# Patient Record
Sex: Female | Born: 1990 | Race: White | Hispanic: No | Marital: Married | Smoking: Never smoker
Health system: Southern US, Community
[De-identification: ages and names within clinical notes are randomized; demographics above are authoritative.]

## PROBLEM LIST (undated history)

## (undated) DIAGNOSIS — H9190 Unspecified hearing loss, unspecified ear: Secondary | ICD-10-CM

## (undated) DIAGNOSIS — F419 Anxiety disorder, unspecified: Secondary | ICD-10-CM

## (undated) HISTORY — PX: WISDOM TOOTH EXTRACTION: SHX21

## (undated) HISTORY — PX: TONSILLECTOMY: SUR1361

---

## 2018-04-05 LAB — OB RESULTS CONSOLE ABO/RH: RH Type: POSITIVE

## 2018-04-05 LAB — OB RESULTS CONSOLE HEPATITIS B SURFACE ANTIGEN: Hepatitis B Surface Ag: NEGATIVE

## 2018-04-05 LAB — OB RESULTS CONSOLE HIV ANTIBODY (ROUTINE TESTING): HIV: NONREACTIVE

## 2018-04-05 LAB — OB RESULTS CONSOLE RUBELLA ANTIBODY, IGM: Rubella: IMMUNE

## 2018-04-05 LAB — OB RESULTS CONSOLE RPR: RPR: NONREACTIVE

## 2018-04-05 LAB — OB RESULTS CONSOLE ANTIBODY SCREEN: Antibody Screen: NEGATIVE

## 2018-04-12 LAB — OB RESULTS CONSOLE GC/CHLAMYDIA
Chlamydia: NEGATIVE
Gonorrhea: NEGATIVE

## 2018-06-11 ENCOUNTER — Other Ambulatory Visit (HOSPITAL_COMMUNITY): Payer: Self-pay | Admitting: Certified Nurse Midwife

## 2018-06-11 DIAGNOSIS — Z3689 Encounter for other specified antenatal screening: Secondary | ICD-10-CM

## 2018-06-30 ENCOUNTER — Encounter (HOSPITAL_COMMUNITY): Payer: Self-pay

## 2018-07-02 ENCOUNTER — Encounter (HOSPITAL_COMMUNITY): Payer: Self-pay

## 2018-07-02 ENCOUNTER — Ambulatory Visit (HOSPITAL_COMMUNITY): Payer: Self-pay

## 2018-07-07 ENCOUNTER — Encounter (HOSPITAL_COMMUNITY): Payer: Self-pay

## 2018-07-07 ENCOUNTER — Ambulatory Visit (HOSPITAL_COMMUNITY)
Admission: RE | Admit: 2018-07-07 | Discharge: 2018-07-07 | Disposition: A | Discharge status: Home / Self Care | Payer: BLUE CROSS/BLUE SHIELD | Source: Ambulatory Visit | Attending: Certified Nurse Midwife | Admitting: Certified Nurse Midwife

## 2018-07-07 DIAGNOSIS — Z3689 Encounter for other specified antenatal screening: Secondary | ICD-10-CM | POA: Insufficient documentation

## 2018-07-07 DIAGNOSIS — Z3A21 21 weeks gestation of pregnancy: Secondary | ICD-10-CM | POA: Insufficient documentation

## 2018-07-07 DIAGNOSIS — Z87798 Personal history of other (corrected) congenital malformations: Secondary | ICD-10-CM | POA: Diagnosis not present

## 2018-07-07 DIAGNOSIS — Z8279 Family history of other congenital malformations, deformations and chromosomal abnormalities: Secondary | ICD-10-CM | POA: Diagnosis not present

## 2018-07-07 HISTORY — DX: Anxiety disorder, unspecified: F41.9

## 2018-07-07 HISTORY — DX: Unspecified hearing loss, unspecified ear: H91.90

## 2018-07-07 NOTE — ED Notes (Signed)
Patient in session with Genetic Counselor. 

## 2018-07-08 ENCOUNTER — Other Ambulatory Visit (HOSPITAL_COMMUNITY): Payer: Self-pay | Admitting: *Deleted

## 2018-07-08 DIAGNOSIS — Z362 Encounter for other antenatal screening follow-up: Secondary | ICD-10-CM

## 2018-08-04 ENCOUNTER — Encounter (HOSPITAL_COMMUNITY): Payer: Self-pay

## 2018-08-04 ENCOUNTER — Ambulatory Visit (HOSPITAL_COMMUNITY)
Admission: RE | Admit: 2018-08-04 | Discharge: 2018-08-04 | Disposition: A | Discharge status: Home / Self Care | Payer: BLUE CROSS/BLUE SHIELD | Source: Ambulatory Visit | Attending: Certified Nurse Midwife | Admitting: Certified Nurse Midwife

## 2018-08-04 DIAGNOSIS — Z3A25 25 weeks gestation of pregnancy: Secondary | ICD-10-CM | POA: Diagnosis not present

## 2018-08-04 DIAGNOSIS — Z362 Encounter for other antenatal screening follow-up: Secondary | ICD-10-CM | POA: Diagnosis present

## 2018-10-25 LAB — OB RESULTS CONSOLE GBS: GBS: NEGATIVE

## 2018-11-03 NOTE — L&D Delivery Note (Signed)
Delivery Note:  MBC labor intrapartum transfer for augmentation / pain control Fentanyl and phenergan x 1 then epidural Labile BP in labor, MSAF  Onset of labor at 2345 11/15/2018 Transfer to [facility] at 2020 11/16/2018 Complete dilation at 11/17/2018 0223 Onset of pushing at 11/17/2018 0223 FHR second stage 2 - variables with late component  Analgesia /Anesthesia intrapartum:Epidural   Delivery of a Live born female  Birth Weight:  pending APGAR: 9, 9   Newborn Delivery   Birth date/time:  11/17/2018 02:40:00 Delivery type:  Vaginal, Spontaneous    Baby delivered OA to ROT, compound L arm, easy shoulders and body, strong cry at birth, to mother's chest for immediate STS.  Nuchal Cord: No  Cord double clamped after cessation of pulsation, cut by [facility].  Collection of cord blood for typing - yes Arterial cord blood sample-   no  Placenta delivered-Spontaneous  with 3 vessels . Placenta to parents per request. Uterine tone firm, bleeding small  L labial laceration identified.  Anesthesia:Epidural  Est. Blood Loss (mL):200 Complications: ,Other Labor Complications: protracted labor, MSAF Mom to postpartum.  Baby Randell Loop to Couplet care / Skin to Skin.  Delivery Report:  Review the Delivery Report for details.     Signed: Neta Mends, CNM, MSN 11/17/2018, 3:18 AM

## 2018-11-16 ENCOUNTER — Encounter (HOSPITAL_COMMUNITY): Payer: Self-pay

## 2018-11-16 ENCOUNTER — Inpatient Hospital Stay (HOSPITAL_COMMUNITY): Payer: BLUE CROSS/BLUE SHIELD | Admitting: Anesthesiology

## 2018-11-16 ENCOUNTER — Inpatient Hospital Stay (HOSPITAL_COMMUNITY)
Admission: AD | Admit: 2018-11-16 | Discharge: 2018-11-18 | DRG: 807 | Disposition: A | Discharge status: Home / Self Care | Payer: BLUE CROSS/BLUE SHIELD

## 2018-11-16 DIAGNOSIS — O9902 Anemia complicating childbirth: Secondary | ICD-10-CM | POA: Diagnosis present

## 2018-11-16 DIAGNOSIS — D649 Anemia, unspecified: Secondary | ICD-10-CM | POA: Diagnosis present

## 2018-11-16 DIAGNOSIS — R030 Elevated blood-pressure reading, without diagnosis of hypertension: Secondary | ICD-10-CM | POA: Diagnosis present

## 2018-11-16 DIAGNOSIS — Z3A39 39 weeks gestation of pregnancy: Secondary | ICD-10-CM | POA: Diagnosis not present

## 2018-11-16 DIAGNOSIS — O26893 Other specified pregnancy related conditions, third trimester: Secondary | ICD-10-CM | POA: Diagnosis present

## 2018-11-16 DIAGNOSIS — O169 Unspecified maternal hypertension, unspecified trimester: Secondary | ICD-10-CM | POA: Diagnosis present

## 2018-11-16 DIAGNOSIS — Z3483 Encounter for supervision of other normal pregnancy, third trimester: Secondary | ICD-10-CM | POA: Diagnosis present

## 2018-11-16 LAB — CBC
HCT: 34.4 % — ABNORMAL LOW (ref 36.0–46.0)
Hemoglobin: 11.2 g/dL — ABNORMAL LOW (ref 12.0–15.0)
MCH: 29.4 pg (ref 26.0–34.0)
MCHC: 32.6 g/dL (ref 30.0–36.0)
MCV: 90.3 fL (ref 80.0–100.0)
Platelets: 227 10*3/uL (ref 150–400)
RBC: 3.81 MIL/uL — ABNORMAL LOW (ref 3.87–5.11)
RDW: 14.7 % (ref 11.5–15.5)
WBC: 16.8 10*3/uL — AB (ref 4.0–10.5)
nRBC: 0 % (ref 0.0–0.2)

## 2018-11-16 LAB — COMPREHENSIVE METABOLIC PANEL
ALBUMIN: 3 g/dL — AB (ref 3.5–5.0)
ALT: 18 U/L (ref 0–44)
ANION GAP: 11 (ref 5–15)
AST: 22 U/L (ref 15–41)
Alkaline Phosphatase: 129 U/L — ABNORMAL HIGH (ref 38–126)
BUN: 7 mg/dL (ref 6–20)
CO2: 20 mmol/L — ABNORMAL LOW (ref 22–32)
Calcium: 8.8 mg/dL — ABNORMAL LOW (ref 8.9–10.3)
Chloride: 102 mmol/L (ref 98–111)
Creatinine, Ser: 0.62 mg/dL (ref 0.44–1.00)
GFR calc Af Amer: >60 mL/min (ref >60)
GFR calc non Af Amer: >60 mL/min (ref >60)
GLUCOSE: 90 mg/dL (ref 70–99)
POTASSIUM: 4 mmol/L (ref 3.5–5.1)
Sodium: 133 mmol/L — ABNORMAL LOW (ref 135–145)
Total Bilirubin: 0.7 mg/dL (ref 0.3–1.2)
Total Protein: 5.9 g/dL — ABNORMAL LOW (ref 6.5–8.1)

## 2018-11-16 LAB — TYPE AND SCREEN
ABO/RH(D): A POS
Antibody Screen: NEGATIVE

## 2018-11-16 MED ORDER — LIDOCAINE HCL (PF) 1 % IJ SOLN
30.0000 mL | INTRAMUSCULAR | Status: DC | PRN
  Filled 2018-11-16: qty 30

## 2018-11-16 MED ORDER — OXYTOCIN 10 UNIT/ML IJ SOLN: 10.0000 [IU] | Freq: Once | INTRAMUSCULAR | Status: DC

## 2018-11-16 MED ORDER — LIDOCAINE HCL (PF) 1 % IJ SOLN
INTRAMUSCULAR | Status: DC | PRN
  Administered 2018-11-16 (×2): 5 mL via EPIDURAL

## 2018-11-16 MED ORDER — PHENYLEPHRINE 40 MCG/ML (10ML) SYRINGE FOR IV PUSH (FOR BLOOD PRESSURE SUPPORT)
80.0000 ug | PREFILLED_SYRINGE | INTRAVENOUS | Status: DC | PRN
  Filled 2018-11-16 (×2): qty 10

## 2018-11-16 MED ORDER — OXYCODONE-ACETAMINOPHEN 5-325 MG PO TABS: 2.0000 | ORAL_TABLET | ORAL | Status: DC | PRN

## 2018-11-16 MED ORDER — FENTANYL 2.5 MCG/ML BUPIVACAINE 1/10 % EPIDURAL INFUSION (WH - ANES)
14.0000 mL/h | INTRAMUSCULAR | Status: DC | PRN
  Administered 2018-11-16: 14 mL/h via EPIDURAL
  Filled 2018-11-16: qty 100

## 2018-11-16 MED ORDER — PHENYLEPHRINE 40 MCG/ML (10ML) SYRINGE FOR IV PUSH (FOR BLOOD PRESSURE SUPPORT)
80.0000 ug | PREFILLED_SYRINGE | INTRAVENOUS | Status: DC | PRN
  Filled 2018-11-16: qty 10

## 2018-11-16 MED ORDER — LACTATED RINGERS IV SOLN
INTRAVENOUS | Status: DC
  Administered 2018-11-16 (×2): via INTRAVENOUS

## 2018-11-16 MED ORDER — SOD CITRATE-CITRIC ACID 500-334 MG/5ML PO SOLN: 30.0000 mL | ORAL | Status: DC | PRN

## 2018-11-16 MED ORDER — OXYTOCIN 40 UNITS IN NORMAL SALINE INFUSION - SIMPLE MED
2.5000 [IU]/h | INTRAVENOUS | Status: DC
  Administered 2018-11-17: 2.5 [IU]/h via INTRAVENOUS
  Filled 2018-11-16: qty 1000

## 2018-11-16 MED ORDER — EPHEDRINE 5 MG/ML INJ
10.0000 mg | INTRAVENOUS | Status: DC | PRN
  Filled 2018-11-16: qty 2

## 2018-11-16 MED ORDER — ONDANSETRON HCL 4 MG/2ML IJ SOLN: 4.0000 mg | Freq: Four times a day (QID) | INTRAMUSCULAR | Status: DC | PRN

## 2018-11-16 MED ORDER — DIPHENHYDRAMINE HCL 50 MG/ML IJ SOLN: 12.5000 mg | INTRAMUSCULAR | Status: DC | PRN

## 2018-11-16 MED ORDER — OXYCODONE-ACETAMINOPHEN 5-325 MG PO TABS: 1.0000 | ORAL_TABLET | ORAL | Status: DC | PRN

## 2018-11-16 MED ORDER — LACTATED RINGERS IV SOLN
INTRAVENOUS | Status: DC
  Administered 2018-11-16: 21:00:00 via INTRAVENOUS

## 2018-11-16 MED ORDER — LACTATED RINGERS IV SOLN
500.0000 mL | INTRAVENOUS | Status: DC | PRN
  Administered 2018-11-17: 500 mL via INTRAVENOUS

## 2018-11-16 MED ORDER — OXYTOCIN 40 UNITS IN NORMAL SALINE INFUSION - SIMPLE MED
1.0000 m[IU]/min | INTRAVENOUS | Status: DC
  Administered 2018-11-16: 2 m[IU]/min via INTRAVENOUS

## 2018-11-16 MED ORDER — TERBUTALINE SULFATE 1 MG/ML IJ SOLN
0.2500 mg | Freq: Once | INTRAMUSCULAR | Status: DC | PRN
  Filled 2018-11-16: qty 1

## 2018-11-16 MED ORDER — FENTANYL CITRATE (PF) 100 MCG/2ML IJ SOLN
100.0000 ug | INTRAMUSCULAR | Status: AC
  Administered 2018-11-16: 100 ug via INTRAVENOUS

## 2018-11-16 MED ORDER — LACTATED RINGERS IV SOLN
500.0000 mL | Freq: Once | INTRAVENOUS | Status: AC
  Administered 2018-11-16: 500 mL via INTRAVENOUS

## 2018-11-16 MED ORDER — OXYTOCIN BOLUS FROM INFUSION
500.0000 mL | Freq: Once | INTRAVENOUS | Status: AC
  Administered 2018-11-17: 500 mL via INTRAVENOUS

## 2018-11-16 MED ORDER — ACETAMINOPHEN 325 MG PO TABS
650.0000 mg | ORAL_TABLET | ORAL | Status: DC | PRN
  Administered 2018-11-16: 650 mg via ORAL
  Filled 2018-11-16: qty 2

## 2018-11-16 MED ORDER — PROMETHAZINE HCL 25 MG/ML IJ SOLN
25.0000 mg | INTRAMUSCULAR | Status: AC
  Administered 2018-11-16: 25 mg via INTRAVENOUS
  Filled 2018-11-16: qty 1

## 2018-11-16 MED ORDER — FENTANYL CITRATE (PF) 100 MCG/2ML IJ SOLN
INTRAMUSCULAR | Status: AC
  Administered 2018-11-16: 100 ug via INTRAVENOUS
  Filled 2018-11-16: qty 2

## 2018-11-16 NOTE — Anesthesia Preprocedure Evaluation (Signed)
Anesthesia Evaluation  Patient identified by MRN, date of birth, ID band Patient awake    Reviewed: Allergy & Precautions, H&P , NPO status , Patient's Chart, lab work & pertinent test results  History of Anesthesia Complications Negative for: history of anesthetic complications  Airway Mallampati: II  TM Distance: >3 FB Neck ROM: full    Dental no notable dental hx. (+) Teeth Intact   Pulmonary neg pulmonary ROS,    Pulmonary exam normal breath sounds clear to auscultation       Cardiovascular negative cardio ROS Normal cardiovascular exam Rhythm:regular Rate:Normal     Neuro/Psych Anxiety negative neurological ROS     GI/Hepatic negative GI ROS, Neg liver ROS,   Endo/Other  negative endocrine ROS  Renal/GU negative Renal ROS  negative genitourinary   Musculoskeletal   Abdominal   Peds  Hematology  (+) Blood dyscrasia, anemia ,   Anesthesia Other Findings   Reproductive/Obstetrics (+) Pregnancy                             Anesthesia Physical Anesthesia Plan  ASA: II  Anesthesia Plan: Epidural   Post-op Pain Management:    Induction:   PONV Risk Score and Plan:   Airway Management Planned:   Additional Equipment:   Intra-op Plan:   Post-operative Plan:   Informed Consent: I have reviewed the patients History and Physical, chart, labs and discussed the procedure including the risks, benefits and alternatives for the proposed anesthesia with the patient or authorized representative who has indicated his/her understanding and acceptance.       Plan Discussed with:   Anesthesia Plan Comments:         Anesthesia Quick Evaluation  

## 2018-11-16 NOTE — Anesthesia Procedure Notes (Signed)
Epidural Patient location during procedure: OB Start time: 11/16/2018 10:09 PM End time: 11/16/2018 10:19 PM  Staffing Anesthesiologist: Leonides Grills, MD Performed: anesthesiologist   Preanesthetic Checklist Completed: patient identified, site marked, pre-op evaluation, timeout performed, IV checked, risks and benefits discussed and monitors and equipment checked  Epidural Patient position: sitting Prep: DuraPrep Patient monitoring: heart rate, cardiac monitor, continuous pulse ox and blood pressure Approach: midline Location: L4-L5 Injection technique: LOR air  Needle:  Needle type: Tuohy  Needle gauge: 17 G Needle length: 9 cm Needle insertion depth: 6 cm Catheter type: closed end flexible Catheter size: 19 Gauge Catheter at skin depth: 11 cm Test dose: negative and Other  Assessment Events: blood not aspirated, injection not painful, no injection resistance and negative IV test  Additional Notes Informed consent obtained prior to proceeding including risk of failure, 1% risk of PDPH, risk of minor discomfort and bruising. Discussed alternatives to epidural analgesia and patient desires to proceed.  Timeout performed pre-procedure verifying patient name, procedure, and platelet count.  Patient tolerated procedure well. Reason for block:procedure for pain

## 2018-11-16 NOTE — MAU Note (Signed)
Pt arrived from [facility] w/CNM [place].  Pt was SROM'd at about 1930, light green mec. Pt has also had labile Bps per CNM. PIH labs per CNM wln. 5-6 cm per CNM.

## 2018-11-16 NOTE — H&P (Addendum)
  OB ADMISSION/ HISTORY & PHYSICAL:  Admission Date: 11/16/2018  8:38 PM  Admit Diagnosis: 39wks CTX LABOR    Cathy Reeves is a 28 y.o. female presenting for augmentation of latent labor / pain control. Transfer [facility] from [facility]. Protracted latent labor since last night, progressed to 5-6 cm / 100 / -1, AROM at 1940, moderate meconium stained fluid noted.  Labile BP with pain, no PEC neural sx, [facility] labs [facility]   Prenatal History: G1P0   EDC : 11/19/2018, by Other Basis  [facility] at [facility] since 1 st trim.   Prenatal course complicated by: Excessive weight gain 42 lbs  Prenatal Labs: ABO, Rh:   A pos Antibody:  neg Rubella:  immune  RPR:   NR HBsAg:   Neg HIV:   Neg GBS:   Neg GDM self screen normal Genetic Screening: Quad screen neg Ultrasound: normal anatomy  Medical / Surgical History :  Past medical history:  Past Medical History:  Diagnosis Date  . Anxiety   . Hearing loss      Past surgical history:  Past Surgical History:  Procedure Laterality Date  . TONSILLECTOMY    . WISDOM TOOTH EXTRACTION       Family History:  Family History  Problem Relation Age of Onset  . Cancer Maternal Aunt   . Cancer Maternal Grandmother   . Cancer Maternal Grandfather   . Diabetes Paternal Grandmother      Social History:  reports that she has never smoked. She has never used smokeless tobacco. She reports previous alcohol use. She reports previous drug use. Drug: Marijuana.   Allergies: Patient has no known allergies.   Current Medications at time of admission:  Medications Prior to Admission  Medication Sig Dispense Refill Last Dose  . Prenatal Vit-Fe Fumarate-FA (PRENATAL VITAMIN PO) Take by mouth.   Taking     Review of Systems: ROS  Physical Exam: Vital signs and nursing notes reviewed.  Vitals:   11/16/18 2042 11/16/18 2104  BP: 136/78 132/82  Pulse: (!) 104 91  Temp:  98.6 F (37 C)  TempSrc:  Oral   SpO2:  100%    General: AAO x 3, NAD, coping poorly w/ ctx Heart: RRR Lungs:CTAB Abdomen: Gravid, NT, Leopold's EFW 8 lbs Extremities: trace pedal edema Genitalia / VE:   5-6/100/-1, acynclitic  FHR: 160 BPM, mod variability, + small accels, - decels TOCO: Ctx q 2 min  Labs:   Pending T&S, CBC, CMP, RPR      Assessment:  28 y.o. G1P0 at 6360w4d Protracted active stage of labor Meconium stained fluid GBS neg Labile BP with normal PEC labs FHT category 1-2  Plan:  1. Admit to BS 2. Routine L&D orders, rpt PEC labs 3. Analgesia/anesthesia PRN  - Fentanyl and Phenergan ordered while in [facility] no available staff or room at this time, high unit acuity 4. Plan Epidural and augmentation / IUPC when unit acuity permits 5. Working towards vaginal delivery   Dr Billy Coastaavon notified of admission / plan of care  Neta MendsDaniela C Paul CNM, MSN 11/16/2018, 8:48 PM

## 2018-11-17 ENCOUNTER — Encounter (HOSPITAL_COMMUNITY): Payer: Self-pay

## 2018-11-17 DIAGNOSIS — O169 Unspecified maternal hypertension, unspecified trimester: Secondary | ICD-10-CM | POA: Diagnosis present

## 2018-11-17 LAB — COMPREHENSIVE METABOLIC PANEL
ALBUMIN: 2.3 g/dL — AB (ref 3.5–5.0)
ALK PHOS: 104 U/L (ref 38–126)
ALT: 16 U/L (ref 0–44)
AST: 25 U/L (ref 15–41)
Anion gap: 8 (ref 5–15)
BUN: 9 mg/dL (ref 6–20)
CO2: 21 mmol/L — ABNORMAL LOW (ref 22–32)
Calcium: 8.2 mg/dL — ABNORMAL LOW (ref 8.9–10.3)
Chloride: 104 mmol/L (ref 98–111)
Creatinine, Ser: 0.73 mg/dL (ref 0.44–1.00)
GFR calc Af Amer: >60 mL/min (ref >60)
GFR calc non Af Amer: >60 mL/min (ref >60)
GLUCOSE: 133 mg/dL — AB (ref 70–99)
Potassium: 4.1 mmol/L (ref 3.5–5.1)
Sodium: 133 mmol/L — ABNORMAL LOW (ref 135–145)
Total Bilirubin: 0.6 mg/dL (ref 0.3–1.2)
Total Protein: 5.2 g/dL — ABNORMAL LOW (ref 6.5–8.1)

## 2018-11-17 LAB — CBC
HCT: 30.5 % — ABNORMAL LOW (ref 36.0–46.0)
HEMOGLOBIN: 9.8 g/dL — AB (ref 12.0–15.0)
MCH: 29.1 pg (ref 26.0–34.0)
MCHC: 32.1 g/dL (ref 30.0–36.0)
MCV: 90.5 fL (ref 80.0–100.0)
Platelets: 197 10*3/uL (ref 150–400)
RBC: 3.37 MIL/uL — ABNORMAL LOW (ref 3.87–5.11)
RDW: 14.8 % (ref 11.5–15.5)
WBC: 19.1 10*3/uL — ABNORMAL HIGH (ref 4.0–10.5)
nRBC: 0.2 % (ref 0.0–0.2)

## 2018-11-17 LAB — RPR: RPR Ser Ql: NONREACTIVE

## 2018-11-17 LAB — ABO/RH: ABO/RH(D): A POS

## 2018-11-17 MED ORDER — ONDANSETRON HCL 4 MG PO TABS: 4.0000 mg | ORAL_TABLET | ORAL | Status: DC | PRN

## 2018-11-17 MED ORDER — DIBUCAINE 1 % RE OINT: 1.0000 "application " | TOPICAL_OINTMENT | RECTAL | Status: DC | PRN

## 2018-11-17 MED ORDER — DIPHENHYDRAMINE HCL 25 MG PO CAPS: 25.0000 mg | ORAL_CAPSULE | Freq: Four times a day (QID) | ORAL | Status: DC | PRN

## 2018-11-17 MED ORDER — WITCH HAZEL-GLYCERIN EX PADS: 1.0000 "application " | MEDICATED_PAD | CUTANEOUS | Status: DC | PRN

## 2018-11-17 MED ORDER — PRENATAL MULTIVITAMIN CH
1.0000 | ORAL_TABLET | Freq: Every day | ORAL | Status: DC
  Filled 2018-11-17 (×2): qty 1

## 2018-11-17 MED ORDER — COCONUT OIL OIL
1.0000 "application " | TOPICAL_OIL | Status: DC | PRN
  Filled 2018-11-17: qty 120

## 2018-11-17 MED ORDER — TETANUS-DIPHTH-ACELL PERTUSSIS 5-2.5-18.5 LF-MCG/0.5 IM SUSP: 0.5000 mL | Freq: Once | INTRAMUSCULAR | Status: DC

## 2018-11-17 MED ORDER — SIMETHICONE 80 MG PO CHEW: 80.0000 mg | CHEWABLE_TABLET | ORAL | Status: DC | PRN

## 2018-11-17 MED ORDER — BISACODYL 10 MG RE SUPP: 10.0000 mg | Freq: Every day | RECTAL | Status: DC | PRN

## 2018-11-17 MED ORDER — ACETAMINOPHEN 325 MG PO TABS
650.0000 mg | ORAL_TABLET | ORAL | Status: DC | PRN
  Administered 2018-11-17 – 2018-11-18 (×3): 650 mg via ORAL
  Filled 2018-11-17 (×3): qty 2

## 2018-11-17 MED ORDER — BENZOCAINE-MENTHOL 20-0.5 % EX AERO: 1.0000 "application " | INHALATION_SPRAY | CUTANEOUS | Status: DC | PRN

## 2018-11-17 MED ORDER — OXYCODONE HCL 5 MG PO TABS
5.0000 mg | ORAL_TABLET | ORAL | Status: DC | PRN
  Administered 2018-11-17: 5 mg via ORAL
  Filled 2018-11-17: qty 1

## 2018-11-17 MED ORDER — DOCUSATE SODIUM 100 MG PO CAPS
100.0000 mg | ORAL_CAPSULE | Freq: Two times a day (BID) | ORAL | Status: DC
  Administered 2018-11-17 – 2018-11-18 (×2): 100 mg via ORAL
  Filled 2018-11-17 (×2): qty 1

## 2018-11-17 MED ORDER — ONDANSETRON HCL 4 MG/2ML IJ SOLN: 4.0000 mg | INTRAMUSCULAR | Status: DC | PRN

## 2018-11-17 MED ORDER — IBUPROFEN 600 MG PO TABS
600.0000 mg | ORAL_TABLET | Freq: Four times a day (QID) | ORAL | Status: DC
  Administered 2018-11-17 – 2018-11-18 (×6): 600 mg via ORAL
  Filled 2018-11-17 (×5): qty 1

## 2018-11-17 MED ORDER — FLEET ENEMA 7-19 GM/118ML RE ENEM: 1.0000 | ENEMA | Freq: Every day | RECTAL | Status: DC | PRN

## 2018-11-17 NOTE — Anesthesia Postprocedure Evaluation (Signed)
Anesthesia Post Note  Patient: Cathy Reeves  Procedure(s) Performed: AN AD HOC LABOR EPIDURAL     Patient location during evaluation: Mother Baby Anesthesia Type: Epidural Level of consciousness: awake, awake and alert and oriented Pain management: pain level controlled Vital Signs Assessment: post-procedure vital signs reviewed and stable Respiratory status: spontaneous breathing and respiratory function stable Cardiovascular status: blood pressure returned to baseline Postop Assessment: no headache, no backache, epidural receding, patient able to bend at knees, no apparent nausea or vomiting, adequate PO intake and able to ambulate Anesthetic complications: no    Last Vitals:  Vitals:   11/17/18 0446 11/17/18 0649  BP: 134/80 130/81  Pulse: 89 (!) 104  Resp:    Temp: 37.3 C 37.1 C  SpO2:      Last Pain:  Vitals:   11/17/18 0649  TempSrc: Oral   Pain Goal:                @ANFLOW60MIN (12500)  )Cleda Clarks

## 2018-11-17 NOTE — Lactation Note (Signed)
This note was copied from a baby's chart. Lactation Consultation Note  Patient Name: Cathy Reeves Date: 11/17/2018 Reason for consult: Initial assessment;Term;Primapara;1st time breastfeeding  P1 mother whose infant is now 3 hours old.  Baby was sleeping in mother's arms when I arrived.  Mother stated she breast fed about 30 minutes ago.  Encouraged to feed 8-12 times/24 hours or sooner if baby shows feeding cues.  Mother is familiar with feeding cues and hand expression.  Colostrum container provided for any EBM she may obtain with hand expression.  Demonstrated finger feeding.  Encouraged to have an RN/LC observe latching and to call for latch assistance as needed.  Mother will be a "stay at home" mother and does not need a DEBP at this time.  Mom made aware of O/P services, breastfeeding support groups, community resources, and our phone # for post-discharge questions. Father present.   Maternal Data Formula Feeding for Exclusion: No Has patient been taught Hand Expression?: Yes Does the patient have breastfeeding experience prior to this delivery?: No  Feeding    LATCH Score                   Interventions    Lactation Tools Discussed/Used     Consult Status Consult Status: Follow-up Date: 11/18/18 Follow-up type: In-patient    Valari Taylor R Maralee Higuchi 11/17/2018, 4:02 PM

## 2018-11-17 NOTE — Progress Notes (Signed)
PPD # 0 S/P NSVD  Live born female  Birth Weight: 9 lb 2.7 oz (4159 g) APGAR: 9, 9  Newborn Delivery   Birth date/time:  11/17/2018 02:40:00 Delivery type:  Vaginal, Spontaneous    Baby name: Cathy Reeves Delivering provider: Arlan Organ C   Feeding: breast  Pain control at delivery: Epidural   S:  Reports feeling sore but well, no PEC s/sx. Happy with birth outcome.             Tolerating po/ No nausea or vomiting             Bleeding is decreased.             Pain controlled with acetaminophen and ibuprofen (OTC)             Up ad lib / ambulatory / voiding without difficulties   O:  A & O x 3, in no apparent distress              VS:  Vitals:   11/17/18 0401 11/17/18 0446 11/17/18 0649 11/17/18 1100  BP: 138/67 134/80 130/81 108/62  Pulse: 89 89 (!) 104 (!) 110  Resp: 18   16  Temp:  99.1 F (37.3 C) 98.7 F (37.1 C) 98.2 F (36.8 C)  TempSrc:  Oral Oral Oral  SpO2:      Weight:      Height:        LABS:  Recent Labs    11/16/18 2120 11/17/18 0512  WBC 16.8* 19.1*  HGB 11.2* 9.8*  HCT 34.4* 30.5*  PLT 227 197    Blood type: --/--/A POS, A POS Performed at [facility], [address], [place], [place] [address]  [phone])  Rubella: Immune (06/03 0000)   I&O: I/O last 3 completed shifts: In: -  Out: 600 [Urine:400; Blood:200]          No intake/output data recorded.  Vaccines: TDaP UTD         Flu    UTD   Gen: AAO x 3, NAD, resting in bed with spouse and baby at [facility]  Heart: regular rate and rhythm / no murmurs  Abdomen: soft, non-tender, non-distended             Fundus: firm, non-tender, U-1  Perineum: repair intact, mild edema  Lochia: small  Extremities: +1 pedal edema, no calf pain or tenderness    A/P: PPD # 0 27 y.o., G1P1001   Principal Problem:   SVD 1/15 Active Problems:   Prolonged latent phase of labor   Postpartum care following vaginal delivery   Elevated blood pressure affecting pregnancy, antepartum  - BP and labs  stable, monitor closely for developing PEC  Doing well - stable status  Routine post partum orders  Anticipate discharge tomorrow    Neta Mends, MSN, CNM 11/17/2018, 6:07 PM

## 2018-11-18 MED ORDER — POLYSACCHARIDE IRON COMPLEX 150 MG PO CAPS: 150.0000 mg | ORAL_CAPSULE | Freq: Two times a day (BID) | ORAL | 3 refills | Status: AC

## 2018-11-18 MED ORDER — MAGNESIUM OXIDE -MG SUPPLEMENT 400 (240 MG) MG PO TABS: 400.0000 mg | ORAL_TABLET | Freq: Every day | ORAL | Status: AC

## 2018-11-18 MED ORDER — IBUPROFEN 600 MG PO TABS: 600.0000 mg | ORAL_TABLET | Freq: Four times a day (QID) | ORAL | 0 refills | Status: AC

## 2018-11-18 MED ORDER — ACETAMINOPHEN 325 MG PO TABS: 650.0000 mg | ORAL_TABLET | ORAL | Status: AC | PRN

## 2018-11-18 MED ORDER — BENZOCAINE-MENTHOL 20-0.5 % EX AERO: 1.0000 "application " | INHALATION_SPRAY | CUTANEOUS | Status: AC | PRN

## 2018-11-18 MED ORDER — COCONUT OIL OIL: 1.0000 "application " | TOPICAL_OIL | 0 refills | Status: AC | PRN

## 2018-11-18 NOTE — Progress Notes (Signed)
Patient ID: Cathy Reeves, female   DOB: Apr 07, 1991, 28 y.o.   MRN: [id number]  PPD # 1 S/P NSVD  Live born female  Birth Weight: 9 lb 2.7 oz (4159 g) APGAR: 9, 9  Newborn Delivery   Birth date/time:  11/17/2018 02:40:00 Delivery type:  Vaginal, Spontaneous    Baby name: Randell Loop Delivering provider: Arlan Organ C    Feeding: breast  Pain control at delivery: Epidural   S:  Reports feeling well, desires DC home             Tolerating po/ No nausea or vomiting             Bleeding is light             Pain controlled with ibuprofen (OTC)             Up ad lib / ambulatory / voiding without difficulties   O:  A & O x 3, in no apparent distress              VS:  Vitals:   11/17/18 1100 11/17/18 1430 11/17/18 1745 11/18/18 0517  BP: 108/62 120/68 118/78 122/89  Pulse: (!) 110 (!) 104 (!) 105 84  Resp: 16 18 18 18   Temp: 98.2 F (36.8 C) 98.4 F (36.9 C) 98 F (36.7 C) 97.6 F (36.4 C)  TempSrc: Oral Oral Oral Oral  SpO2:    99%  Weight:      Height:        LABS:  Recent Labs    11/16/18 2120 11/17/18 0512  WBC 16.8* 19.1*  HGB 11.2* 9.8*  HCT 34.4* 30.5*  PLT 227 197    Blood type: --/--/A POS, A POS Performed at [facility], [address] [place], [place] [address]  (01/14 2120)  Rubella: Immune (06/03 0000)   I&O: I/O last 3 completed shifts: In: -  Out: 600 [Urine:400; Blood:200]          No intake/output data recorded.  Vaccines: TDaP UTD         Flu    UTD   Lungs: Clear and unlabored  Heart: regular rate and rhythm / no murmurs  Abdomen: soft, non-tender, non-distended             Fundus: firm, non-tender, U-1  Perineum: repair intact  Lochia: small  Extremities: trace pedal edema, no calf pain or tenderness    A/P: PPD # 1 27 y.o., G1P1001   Principal Problem:   SVD 1/15 Active Problems:   Prolonged latent phase of labor   Postpartum care following vaginal delivery   Elevated blood pressure affecting pregnancy, antepartum  -  resolving - normal BP and neg PEC labs, no neural sx. Maternal anemia  - started oral Fe and Mag ox Hx depression  - PPD sx discussed, when to call   Doing well - stable status  Routine post partum orders             DC home today w/ instructions  F/U at [facility] in 2 weeks and PRN     Neta Mends, MSN, CNM 11/18/2018, 1:07 PM

## 2018-11-18 NOTE — Progress Notes (Signed)
MOB was referred for history of depression/anxiety. * Referral screened out by Clinical Social Worker because none of the following criteria appear to apply: ~ History of anxiety/depression during this pregnancy, or of post-partum depression following prior delivery. ~ Diagnosis of anxiety and/or depression within last 3 years. Per OB record MOB has not had any symptoms in over 5 years.  OR * MOB's symptoms currently being treated with medication and/or therapy. Please contact the Clinical Social Worker if needs arise, by MOB request, or if MOB scores greater than 9/yes to question 10 on Edinburgh Postpartum Depression Screen.  Charlie Char Boyd-Gilyard, MSW, LCSW Clinical Social Work [phone] 

## 2018-11-18 NOTE — Discharge Instructions (Signed)
Check BP in 1 week and send info to [facility] Patient portal.

## 2018-11-18 NOTE — Discharge Summary (Signed)
OB Discharge Summary  Patient Name: Cathy Reeves DOB: 02/05/1991 MRN: [id number]  Date of admission: 11/16/2018 Delivering provider: Neta MendsPAUL,  C   Date of discharge: 11/18/2018  Admitting diagnosis: 39wks CTX LABOR Intrauterine pregnancy: 3528w5d     Secondary diagnosis:Principal Problem:   SVD 1/15 Active Problems:   Prolonged latent phase of labor   Postpartum care following vaginal delivery   Elevated blood pressure affecting pregnancy, antepartum  Additional problems:maternal anemia     Discharge diagnosis:  Patient Active Problem List   Diagnosis Date Noted  . SVD 1/15 11/17/2018  . Postpartum care following vaginal delivery 11/17/2018  . Elevated blood pressure affecting pregnancy, antepartum 11/17/2018  . Prolonged latent phase of labor 11/16/2018                                                                Post partum procedures:none  Augmentation: AROM and Pitocin Pain control: Epidural  Laceration:None;1st degree;Labial  Episiotomy:None  Complications: None APGAR: 8/9  Hospital course:  Onset of Labor With Vaginal Delivery     [age] y.o. yo G1P1001 at 6528w5d was admitted in Latent Labor on 11/16/2018. Arrest of dilation at 5 cm. [facility] for augmentation of labor and pain control. Labile BP in labor, normal preeclampsia labs. Patient had an uncomplicated labor course as follows:  Membrane Rupture Time/Date: 7:30 PM ,11/16/2018   Intrapartum Procedures: Episiotomy: None [1]                                         Lacerations:  None [1];1st degree [2];Labial [10]  Patient had a delivery of a Viable infant. 11/17/2018  Information for the patient's newborn:  Christell ConstantMoore, Girl Marnesha [[id number]][[id number]]  Delivery Method: Vag-Spont    Pateint had an uncomplicated postpartum course.  She is ambulating, tolerating a regular diet, passing flatus, and urinating well. Patient is discharged home in stable condition on 11/18/18.   Physical exam   Vitals:   11/17/18 1100 11/17/18 1430 11/17/18 1745 11/18/18 0517  BP: 108/62 120/68 118/78 122/89  Pulse: (!) 110 (!) 104 (!) 105 84  Resp: 16 18 18 18   Temp: 98.2 F (36.8 C) 98.4 F (36.9 C) 98 F (36.7 C) 97.6 F (36.4 C)  TempSrc: Oral Oral Oral Oral  SpO2:    99%  Weight:      Height:       General: alert, cooperative and no distress Lochia: appropriate Uterine Fundus: firm Incision: Healing well with no significant drainage DVT Evaluation: No cords or calf tenderness. Calf/Ankle edema is present Labs: Lab Results  Component Value Date   WBC 19.1 (H) 11/17/2018   HGB 9.8 (L) 11/17/2018   HCT 30.5 (L) 11/17/2018   MCV 90.5 11/17/2018   PLT 197 11/17/2018   CMP Latest Ref Rng & Units 11/17/2018  Glucose 70 - 99 mg/dL 782(N133(H)  BUN 6 - 20 mg/dL 9  Creatinine 5.620.44 - 1.301.00 mg/dL 8.650.73  Sodium 784135 - 696145 mmol/L 133(L)  Potassium 3.5 - 5.1 mmol/L 4.1  Chloride 98 - 111 mmol/L 104  CO2 22 - 32 mmol/L 21(L)  Calcium 8.9 - 10.3 mg/dL 8.2(L)  Total Protein 6.5 -  8.1 g/dL 5.2(L)  Total Bilirubin 0.3 - 1.2 mg/dL 0.6  Alkaline Phos 38 - 126 U/L 104  AST 15 - 41 U/L 25  ALT 0 - 44 U/L 16    Vaccines: TDaP UTD         Flu    UTD  Discharge instruction: per After Visit Summary and "Baby and Me Booklet".  After Visit Meds:  Allergies as of 11/18/2018   No Known Allergies     Medication List    TAKE these medications   acetaminophen 325 MG tablet Commonly known as:  TYLENOL Take 2 tablets (650 mg total) by mouth every 4 (four) hours as needed (for pain scale < 4).   benzocaine-Menthol 20-0.5 % Aero Commonly known as:  DERMOPLAST Apply 1 application topically as needed for irritation (perineal discomfort).   coconut oil Oil Apply 1 application topically as needed.   Evening Primrose Oil 1000 MG Caps Take 2,000 mg by mouth daily.   ibuprofen 600 MG tablet Commonly known as:  ADVIL,MOTRIN Take 1 tablet (600 mg total) by mouth every 6 (six) hours.   iron  polysaccharides 150 MG capsule Commonly known as:  NU-IRON Take 1 capsule (150 mg total) by mouth 2 (two) times daily.   Magnesium Oxide 400 (240 Mg) MG Tabs Take 1 tablet (400 mg total) by mouth daily. For prevention of constipation.   prenatal multivitamin Tabs tablet Take 1 tablet by mouth daily at 12 noon.            Discharge Care Instructions  (From admission, onward)         Start     Ordered   11/18/18 0000  Discharge wound care:    Comments:  Sitz baths 2 times /day with warm water x 1 week   11/18/18 1325          Diet: routine diet  Activity: Advance as tolerated. Pelvic rest for 6 weeks.   Postpartum contraception: Not Discussed  Newborn Data: Live born female  Birth Weight: 9 lb 2.7 oz (4159 g) APGAR: 9, 9  Newborn Delivery   Birth date/time:  11/17/2018 02:40:00 Delivery type:  Vaginal, Spontaneous     named Randell LoopIna Beth Baby Feeding: Breast Disposition:home with mother   Delivery Report:  Review the Delivery Report for details.    Follow up: Patient to call in 1 week with BP check at home. Follow-up Information    Neta Mendsaul,  C, CNM. Schedule an appointment as soon as possible for a visit in 2 week(s).   Specialty:  Obstetrics and Gynecology Contact information: [address] [place] [id number] [phone]             Signed: Cipriano Mile C , CNM, MSN 11/18/2018, 1:26 PM

## 2020-06-09 IMAGING — US US MFM OB DETAIL+14 WK
1 series · 14 of 28 positions shown · non-contrast
Comparison: none

[Series 1: us mfm ob detail+14 wk · 14 of 67 slices shown]
[im 3/67]
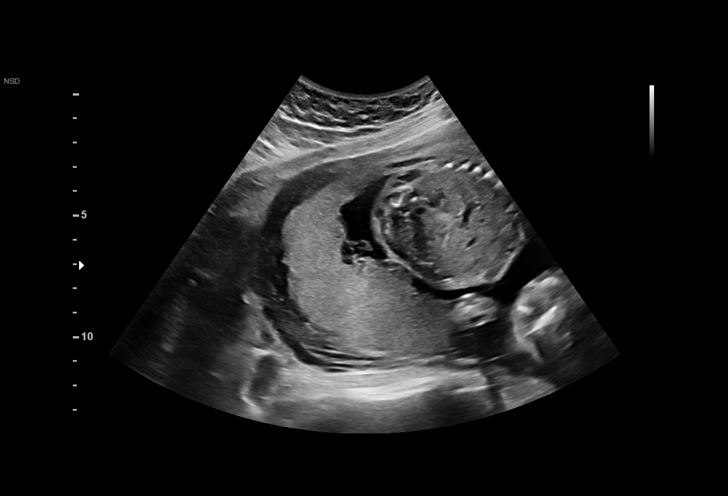
[im 8/67]
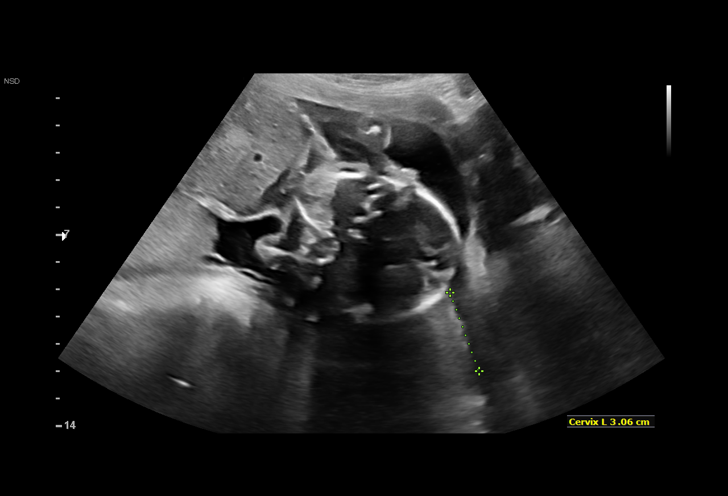
[im 13/67]
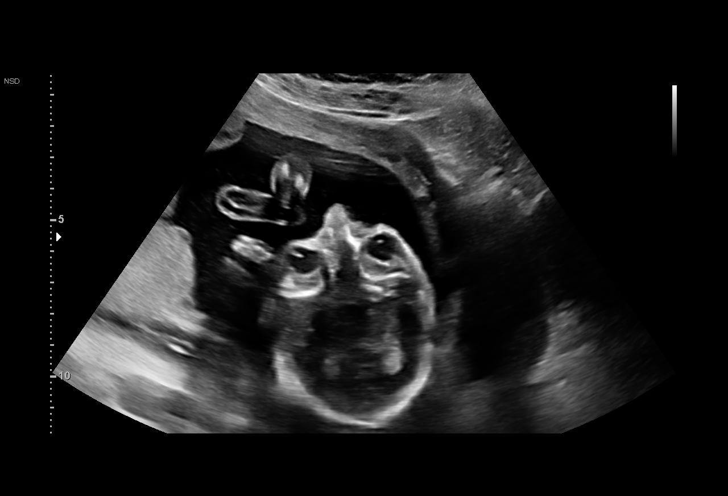
[im 18/67]
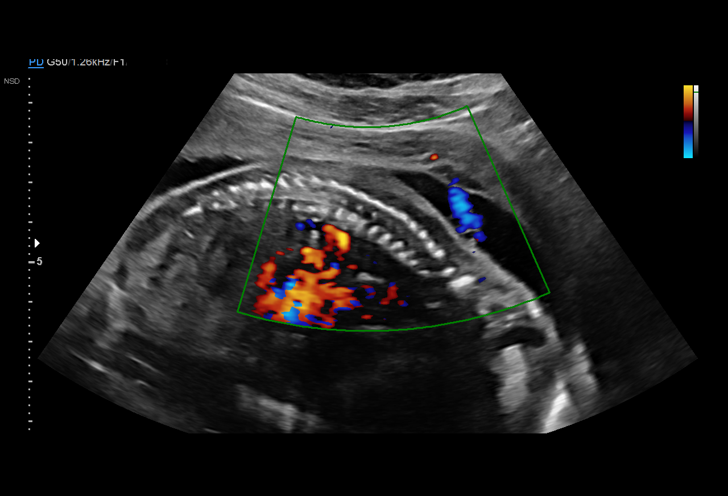
[im 23/67]
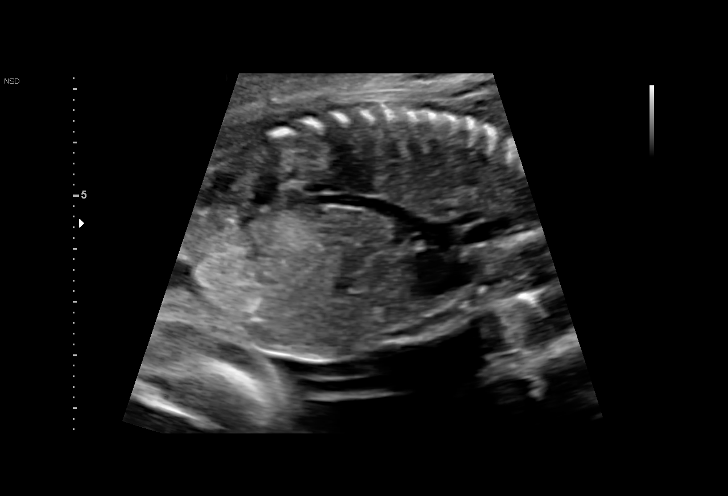
[im 27/67]
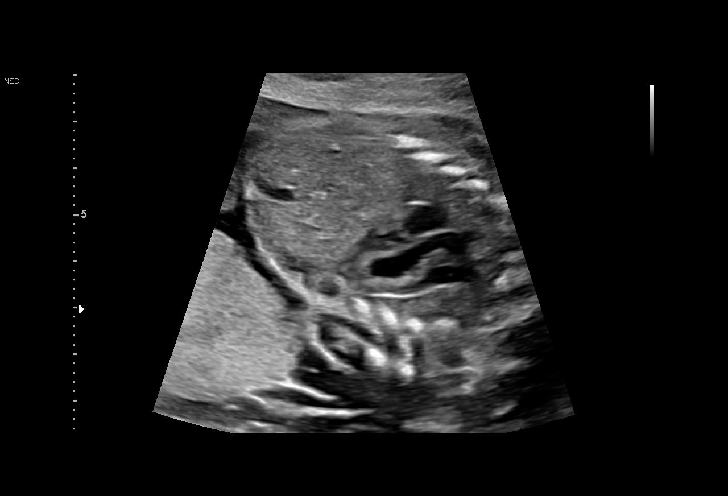
[im 32/67]
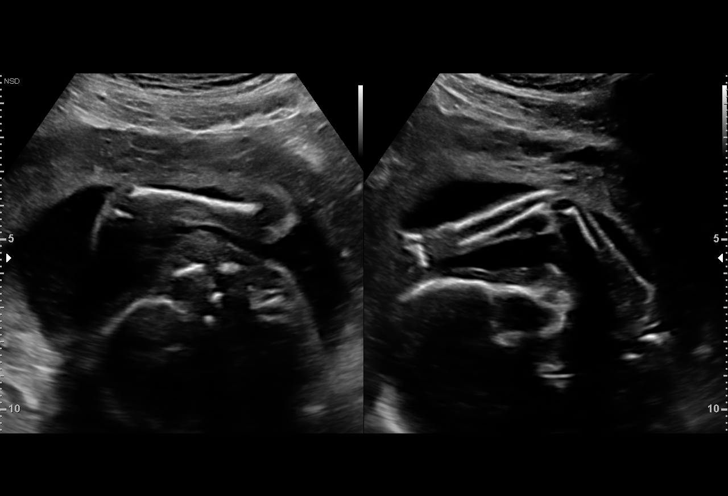
[im 37/67]
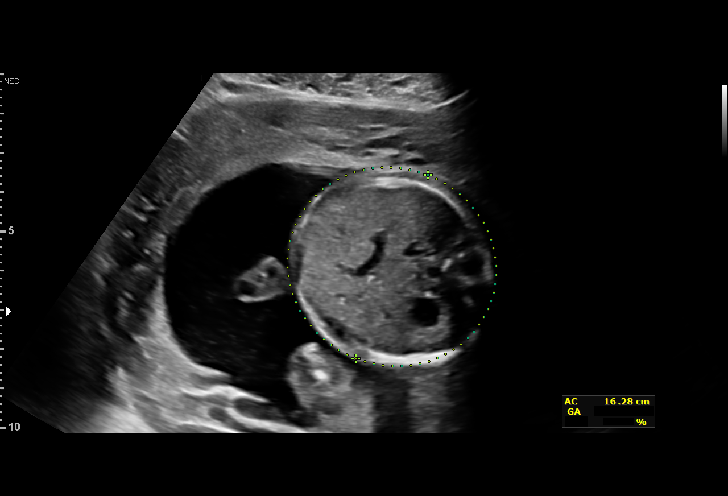
[im 42/67]
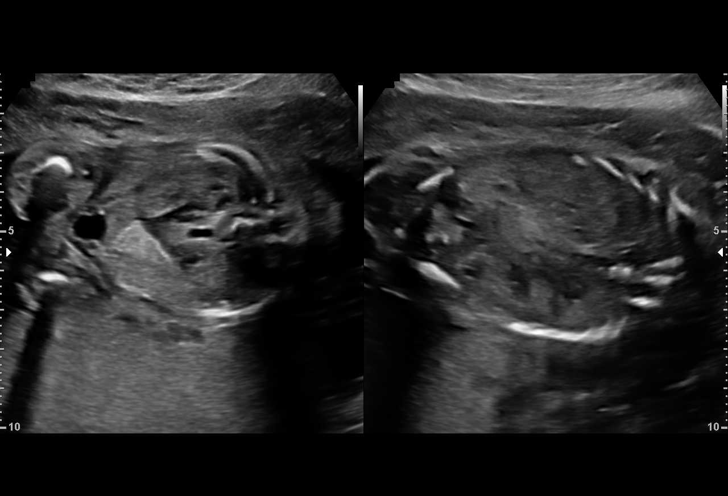
[im 47/67]
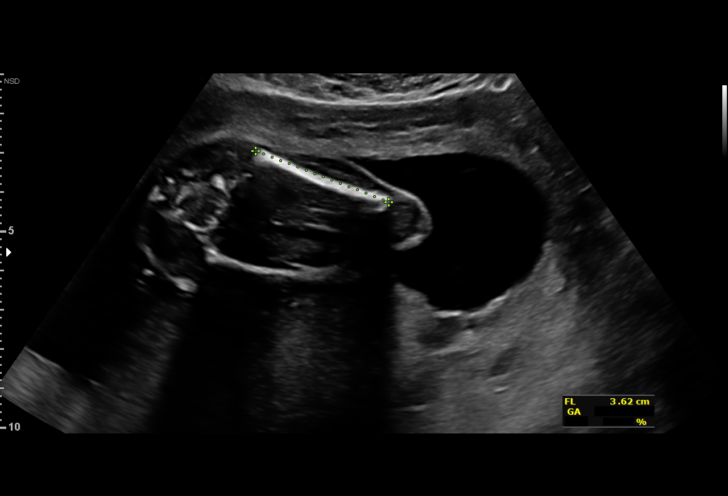
[im 52/67]
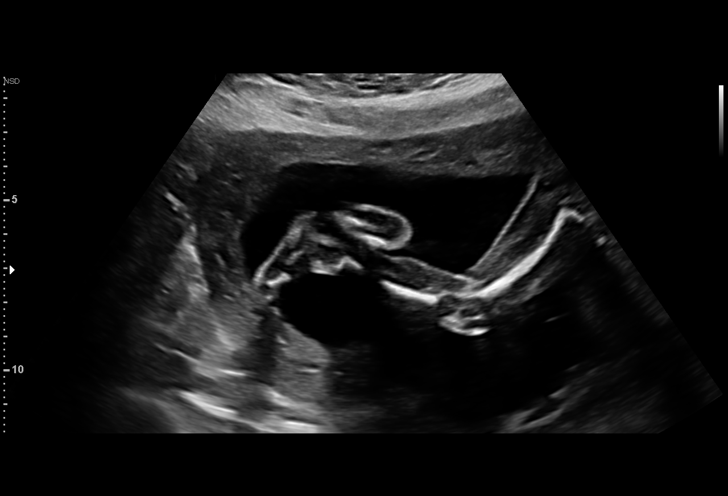
[im 57/67]
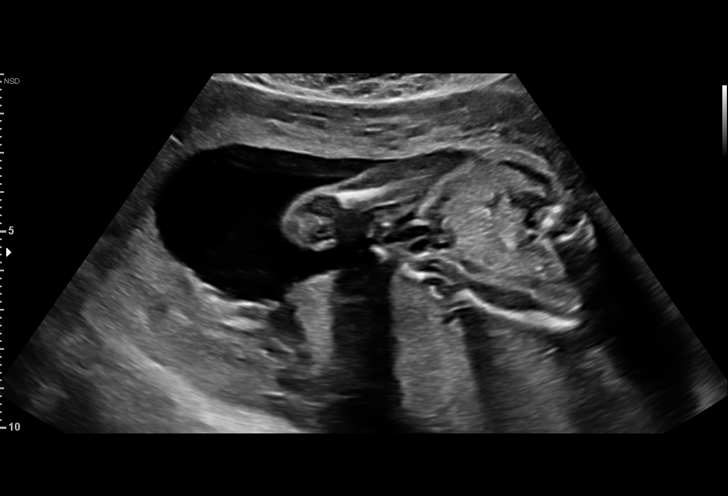
[im 62/67]
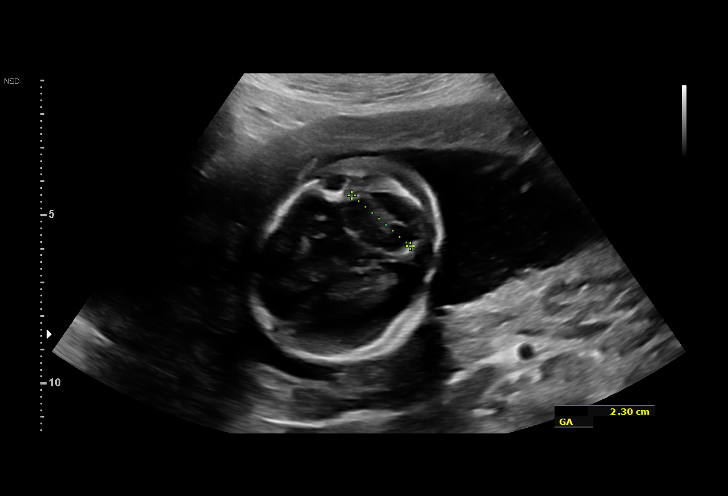
[im 67/67]
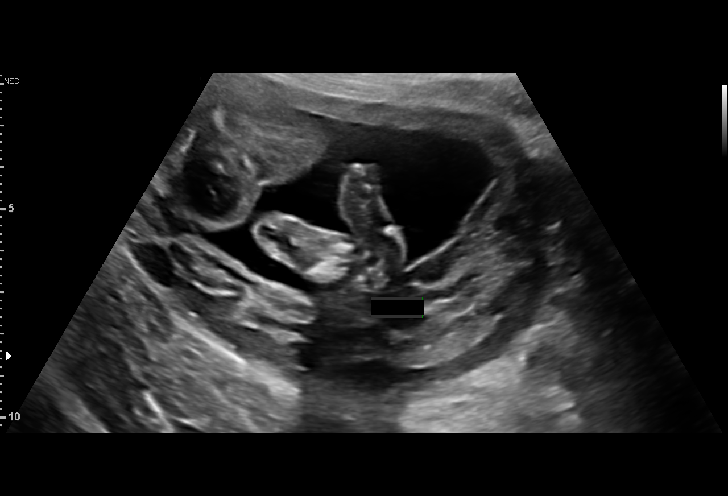

[14 of 28 positions shown; findings below may reference images not displayed]

Center

Indications

21 weeks gestation of pregnancy
Personal history of congenital abnormality
(partial hearing loss)
Family history of congenital anomaly (Pt
sister Spina Bifida)
Fetal Evaluation

Num Of Fetuses:         1
Fetal Heart Rate(bpm):  122
Cardiac Activity:       Observed
Presentation:           Cephalic
Placenta:               Fundal
P. Cord Insertion:      Visualized, central

Amniotic Fluid
AFI FV:      Within normal limits

Largest Pocket(cm)
6.01
Biometry

BPD:      50.3  mm     G. Age:  21w 2d         59  %    CI:        76.21   %    70 - 86
FL/HC:      19.8   %    15.9 -
HC:      182.6  mm     G. Age:  20w 5d         25  %    HC/AC:      1.12        1.06 -
AC:      162.9  mm     G. Age:  21w 3d         55  %    FL/BPD:     71.8   %
FL:       36.1  mm     G. Age:  21w 3d         56  %    FL/AC:      22.2   %    20 - 24
HUM:      35.4  mm     G. Age:  22w 2d         77  %
CER:      23.5  mm     G. Age:  21w 5d         73  %

Est. FW:     415  gm    0 lb 15 oz      51  %
OB History

Gravidity:    1         Term:   0        Prem:   0        SAB:   0
TOP:          0       Ectopic:  0        Living: 0
Gestational Age

LMP:           21w 0d        Date:  02/10/18                 EDD:   11/17/18
U/S Today:     21w 2d                                        EDD:   11/15/18
Best:          21w 0d     Det. By:  LMP  (02/10/18)          EDD:   11/17/18
Anatomy

Cranium:               Appears normal         LVOT:                   Appears normal
Cavum:                 Not well visualized    Aortic Arch:            Appears normal
Ventricles:            Not well visualized    Ductal Arch:            Not well visualized
Choroid Plexus:        Appears normal         Diaphragm:              Appears normal
Cerebellum:            Appears normal         Stomach:                Appears normal, left
sided
Posterior Fossa:       Appears normal         Abdomen:                Appears normal
Nuchal Fold:           Not applicable (>20    Abdominal Wall:         Appears nml (cord
wks GA)                                        insert, abd wall)
Face:                  Orbits appear          Cord Vessels:           Appears normal (3
normal                                         vessel cord)
Lips:                  Not well visualized    Kidneys:                Appear normal
Palate:                Not well visualized    Bladder:                Appears normal
Thoracic:              Appears normal         Spine:                  Appears normal
Heart:                 Appears normal         Upper Extremities:      Visualized
(4CH, axis, and situs
RVOT:                  Appears normal         Lower Extremities:      Visualized

Other:  Parents do not wish to know sex of fetus. Heels visualized.
Technically difficult due to fetal position.
Cervix Uterus Adnexa

Cervix
Length:            3.1  cm.
Normal appearance by transabdominal scan.

Left Ovary
Within normal limits.

Right Ovary
Not visualized.

Adnexa
No abnormality visualized.
Comments

U/S images reviewed. Findings reviewed with patient.
Appropriate fetal growth is noted.  No fetal abnormalities are
seen.
Questions answered.
10 minutes spent face to face.
Recommendations: 1) Genetic counseling has already been
scheduled for today (family history of NTD) 2) Completion of
anatomy in 4 weeks
Recommendations

1) Genetic counseling has already been scheduled for today
(family history of NTD) 2) Completion of anatomy in 4 weeks

## 2023-12-10 ENCOUNTER — Other Ambulatory Visit: Payer: Self-pay

## 2023-12-10 DIAGNOSIS — Z3480 Encounter for supervision of other normal pregnancy, unspecified trimester: Secondary | ICD-10-CM

## 2023-12-25 ENCOUNTER — Ambulatory Visit
Admission: RE | Admit: 2023-12-25 | Discharge: 2023-12-25 | Disposition: A | Discharge status: Home / Self Care | Payer: Medicaid Other | Source: Ambulatory Visit

## 2023-12-25 DIAGNOSIS — Z3480 Encounter for supervision of other normal pregnancy, unspecified trimester: Secondary | ICD-10-CM | POA: Diagnosis present

## 2023-12-25 DIAGNOSIS — Z3689 Encounter for other specified antenatal screening: Secondary | ICD-10-CM | POA: Diagnosis not present

## 2023-12-25 DIAGNOSIS — Z3A18 18 weeks gestation of pregnancy: Secondary | ICD-10-CM | POA: Insufficient documentation
